# Patient Record
Sex: Male | Born: 1980 | Race: White | Hispanic: No | Marital: Single | Smoking: Never smoker
Health system: Southern US, Community
[De-identification: ages and names within clinical notes are randomized; demographics above are authoritative.]

---

## 2020-04-12 ENCOUNTER — Emergency Department (HOSPITAL_BASED_OUTPATIENT_CLINIC_OR_DEPARTMENT_OTHER)
Admission: EM | Admit: 2020-04-12 | Discharge: 2020-04-12 | Disposition: A | Discharge status: Home / Self Care | Payer: Self-pay | Attending: Emergency Medicine | Admitting: Emergency Medicine

## 2020-04-12 ENCOUNTER — Emergency Department (HOSPITAL_BASED_OUTPATIENT_CLINIC_OR_DEPARTMENT_OTHER): Payer: Self-pay

## 2020-04-12 ENCOUNTER — Encounter (HOSPITAL_BASED_OUTPATIENT_CLINIC_OR_DEPARTMENT_OTHER): Payer: Self-pay | Admitting: *Deleted

## 2020-04-12 ENCOUNTER — Other Ambulatory Visit: Payer: Self-pay

## 2020-04-12 DIAGNOSIS — N503 Cyst of epididymis: Secondary | ICD-10-CM | POA: Insufficient documentation

## 2020-04-12 LAB — URINALYSIS, ROUTINE W REFLEX MICROSCOPIC
Bilirubin Urine: NEGATIVE
Glucose, UA: NEGATIVE mg/dL
Hgb urine dipstick: NEGATIVE
Ketones, ur: NEGATIVE mg/dL
Leukocytes,Ua: NEGATIVE
Nitrite: NEGATIVE
Protein, ur: NEGATIVE mg/dL
Specific Gravity, Urine: 1.025 (ref 1.005–1.030)
pH: 6 (ref 5.0–8.0)

## 2020-04-12 NOTE — ED Provider Notes (Signed)
Emergency Department Provider Note   I have reviewed the triage vital signs and the nursing notes.   HISTORY  Chief Complaint Testicle Pain   HPI Keith Sutton is a 40 y.o. male presents to the emergency department for evaluation of 3 weeks of left testicle pressure.  Denies severe, twisting or stabbing pain.  No discomfort with urination.  Pain is mainly at the top of the left testicle.  He feels some fullness there as well when he palpates but it is not significantly tender.  No concern for sexually transmitted infection.  No urethral discharge.  No lower abdomen or flank discomfort. No vomiting. No overlying skin changes.    No past medical history on file.  There are no problems to display for this patient.  Allergies Patient has no known allergies.  No family history on file.  Social History Social History   Tobacco Use  . Smoking status: Never Smoker  Substance Use Topics  . 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 ENT: No sore throat. Cardiovascular: Denies chest pain. Respiratory: Denies shortness of breath. Gastrointestinal: No abdominal pain.  No nausea, no vomiting.  No diarrhea.  No constipation. Genitourinary: Negative for dysuria. Positive left testicle pain.  Musculoskeletal: Negative for back pain. Skin: Negative for rash. Neurological: Negative for headaches, focal weakness or numbness.  10-point ROS otherwise negative.  ____________________________________________   PHYSICAL EXAM:  VITAL SIGNS: ED Triage Vitals  Enc Vitals Group     BP 04/12/20 1323 99/78     Pulse Rate 04/12/20 1323 94     Resp 04/12/20 1323 18     Temp 04/12/20 1323 97.8 F (36.6 C)     Temp Source 04/12/20 1323 Oral     SpO2 04/12/20 1323 100 %     Weight 04/12/20 1326 170 lb (77.1 kg)     Height  04/12/20 1326 5\' 7"  (1.702 m)   Constitutional: Alert and oriented. Well appearing and in no acute distress. Eyes: Conjunctivae are normal. Head: Atraumatic. Nose: No congestion/rhinnorhea. Mouth/Throat: Mucous membranes are moist. Neck: No stridor.   Cardiovascular: Normal rate, regular rhythm. Good peripheral circulation. Grossly normal heart sounds.   Respiratory: Normal respiratory effort.  No retractions. Lungs CTAB. Gastrointestinal: Soft and nontender. No distention.  Genitourinary: Nurse chaperone at bedside for exam. Patient's verbal consent obtained prior to exam. Normal testicular lye. Minimal discomfort at the superior aspect of the left testicle. No masses.  Musculoskeletal: No lower extremity tenderness nor edema.  Neurologic:  Normal speech and language. No gross focal neurologic deficits are appreciated.  Skin:  Skin is warm, dry and intact. No rash noted.  ____________________________________________   LABS (all labs ordered are listed, but only abnormal results are displayed)  Labs Reviewed  URINALYSIS, ROUTINE W REFLEX MICROSCOPIC  GC/CHLAMYDIA PROBE AMP ([place]) NOT AT [facility]   ____________________________________________  RADIOLOGY  OTTO [facility] SCROTUM W/DOPPLER  Result Date: 04/12/2020 CLINICAL DATA:  Left scrotal pressure for 3 weeks EXAM: SCROTAL ULTRASOUND DOPPLER ULTRASOUND OF THE TESTICLES TECHNIQUE: Complete ultrasound examination of the testicles, epididymis, and other scrotal structures was performed. Color and spectral Doppler ultrasound were also utilized to evaluate blood flow to the testicles. COMPARISON:  None. FINDINGS: Right testicle Measurements: 4.1 x 2.4 x 3.1 cm. No mass or microlithiasis visualized. Left testicle Measurements: 4.1  x 2.3 x 3.0 cm. No mass or microlithiasis visualized. Right epididymis: Grossly normal in size. Incidental epididymal cyst measuring 10 x 7 x 6 mm. Left epididymis: Mildly enlarged, due to a 13 x 11 x 14 mm simple  epididymal cyst. Hydrocele:  None visualized. Varicocele:  None visualized. Pulsed Doppler interrogation of both testes demonstrates normal low resistance arterial and venous waveforms bilaterally. IMPRESSION: 1. Unremarkable appearance of the bilateral testes. 2. Bilateral epididymal cysts, left larger than right, of doubtful clinical significance. Electronically Signed   By: Sharlet Salina M.D.   On: 04/12/2020 15:06    ____________________________________________   PROCEDURES  Procedure(s) performed:   Procedures  None  ____________________________________________   INITIAL IMPRESSION / ASSESSMENT AND PLAN / ED COURSE  Pertinent labs & imaging results that were available during my care of the patient were reviewed by me and considered in my medical decision making (see chart for details).   Patient presents to the emergency department with fairly mild pain in the top of the left testicle for the past 3 weeks.  My suspicion for torsion is low.  Be hydrocele versus epididymitis.  UA is negative.  Plan to send urine gonorrhea and chlamydia.  Patient with no urethral discharge to swab.   Testicular ultrasound is pending.  Care transferred to Dr. Renaye Rakers.    ____________________________________________  FINAL CLINICAL IMPRESSION(S) / ED DIAGNOSES  Final diagnoses:  Epididymal cyst     Note:  This document was prepared using Dragon voice recognition software and may include unintentional dictation errors.  Alona Bene, MD, [facility] Emergency Medicine    Lucella Pommier, Arlyss Repress, MD 04/13/20 [phone]

## 2020-04-12 NOTE — ED Notes (Signed)
ED Provider at bedside. 

## 2020-04-12 NOTE — ED Triage Notes (Signed)
Pain at left testicle, began approx 3 weeks. Having some pressure and swelling. No issues with voiding.

## 2020-04-12 NOTE — ED Provider Notes (Signed)
Patient received as signout from Dr Jacqulyn Bath.  Ultrasound showing epididymal cyst without evidence of inflammation.  I re-examined the patient and can palpate the cyst.  There is very minimal tenderness here.  I have a fairly low suspicion for infection or epididymitis.  We talked about antibiotics, and we've agreed to hold off on antibiotics at this time.  If he continues having symptoms he can follow up with a urologist, but his presentation is completely benign at this time.   Terald Sleeper, MD 04/12/20 [phone]

## 2020-04-12 NOTE — Discharge Instructions (Addendum)
You have a small cyst near the top of both testicles.  These are benign.  I did not feel this was infected at this time.  You can take tylenol and motrin as needed this week if it is uncomfortable, but this should get better.   If you have continued pain after several days, schedule an appointment with a urologist.  If you begin having worsening pain, change in color of your scrotum (turning dark red, blue, purple, or black), difficulty urinating, please return to the ER.

## 2020-04-12 NOTE — ED Notes (Signed)
Urine spec cup provided to pt

## 2020-04-13 LAB — GC/CHLAMYDIA PROBE AMP (~~LOC~~) NOT AT ARMC
Chlamydia: NEGATIVE
Comment: NEGATIVE
Comment: NORMAL
Neisseria Gonorrhea: NEGATIVE

## 2022-03-10 IMAGING — US US SCROTUM W/ DOPPLER COMPLETE
1 series · 14 of 25 positions shown · non-contrast
Comparison: None.

CLINICAL DATA: Left scrotal pressure for 3 weeks

EXAM:
SCROTAL ULTRASOUND
DOPPLER ULTRASOUND OF THE TESTICLES
TECHNIQUE: Complete ultrasound examination of the testicles, epididymis, and
other scrotal structures was performed. Color and spectral Doppler
ultrasound were also utilized to evaluate blood flow to the
testicles.

[Series 1: us scrotum w/ doppler complete · 37 acquisitions, 14 frames shown]
[im 1/37]
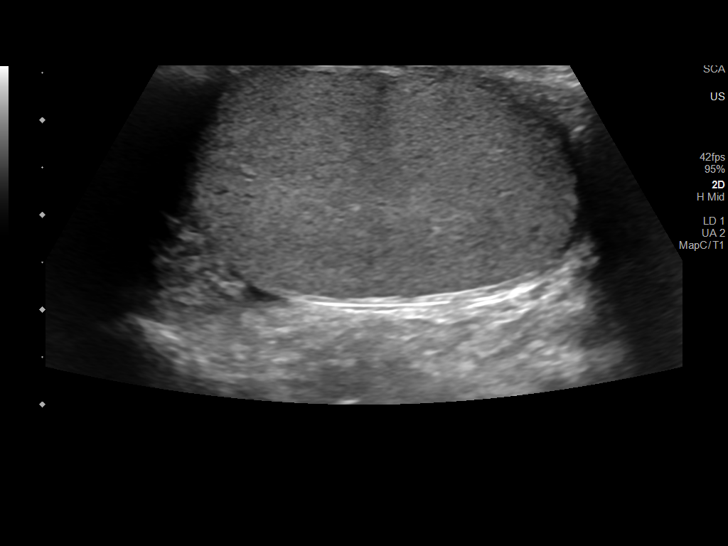
[im 4/37]
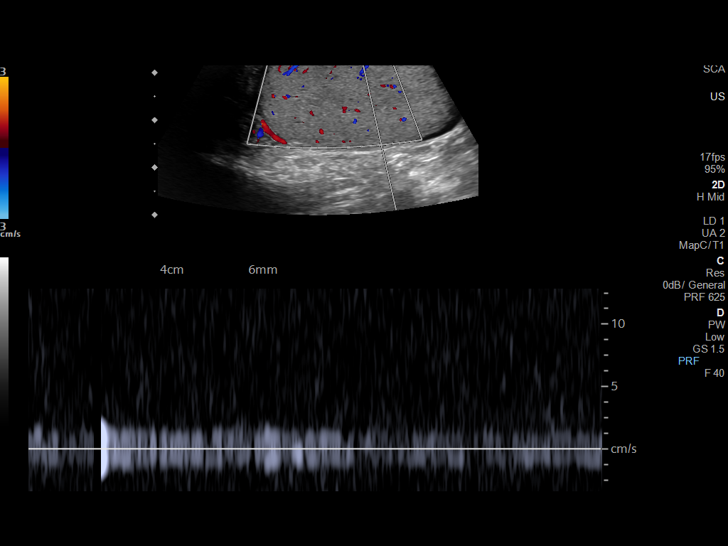
[im 7/37]
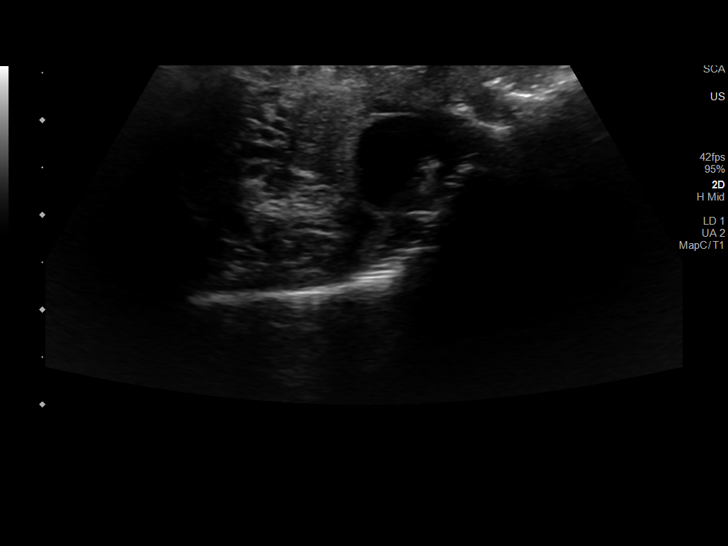
[im 10/37]
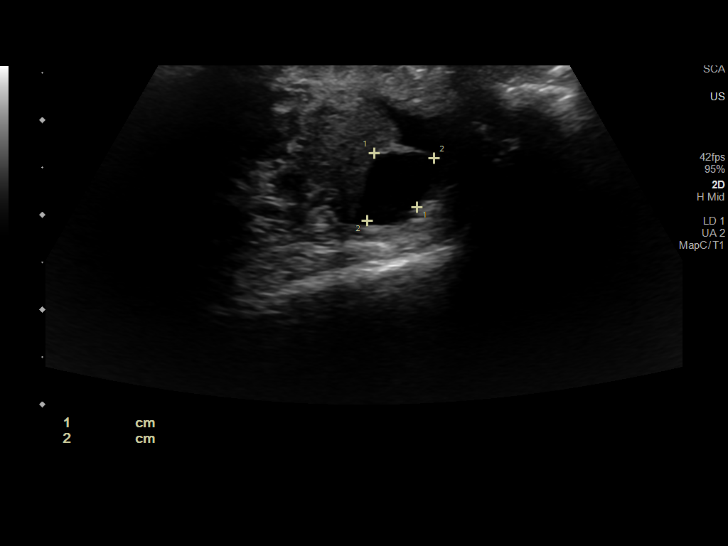
[im 13/37]
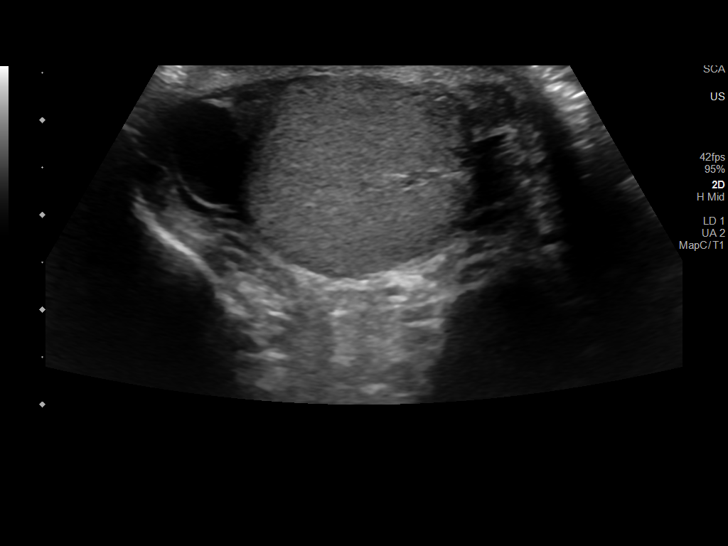
[im 14/37]
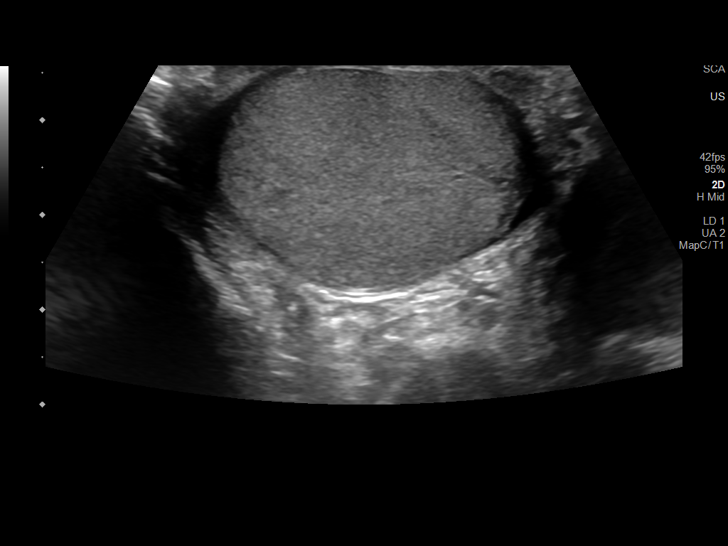
[im 17/37]
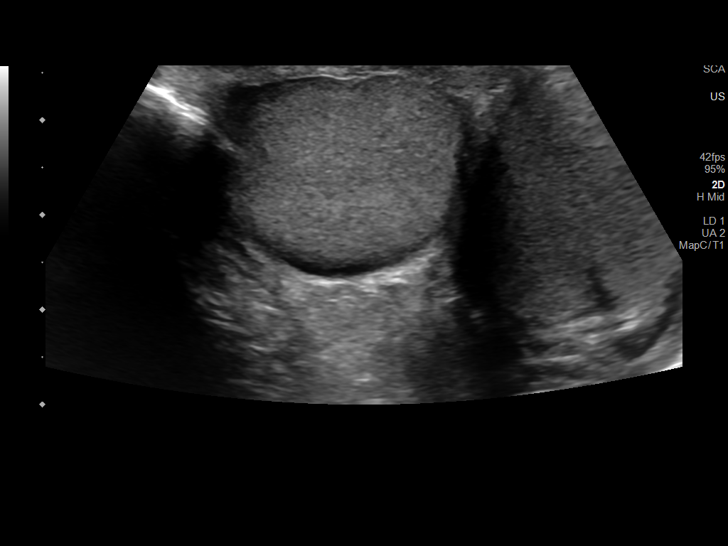
[im 20/37]
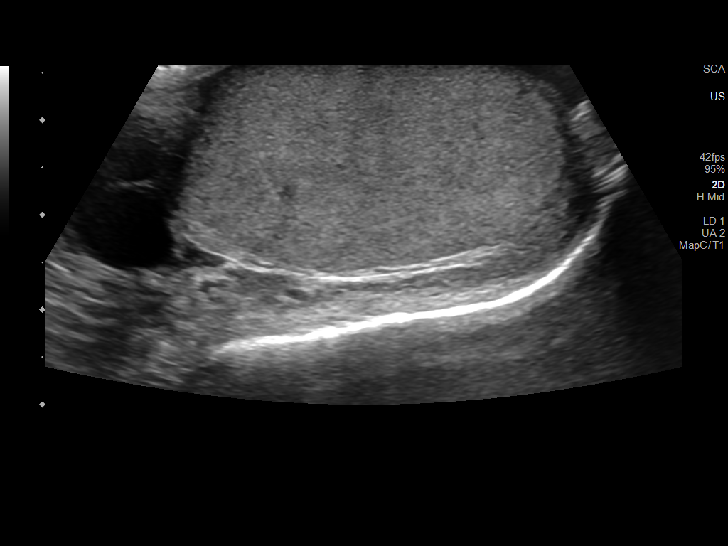
[im 23/37]
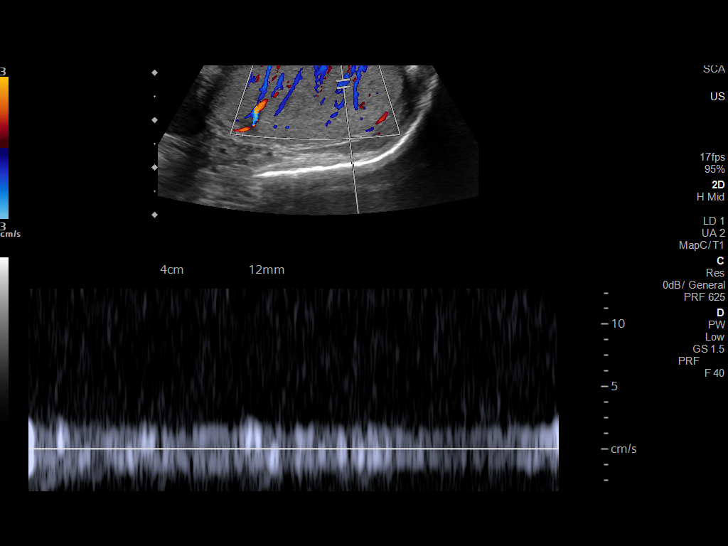
[im 25/37]
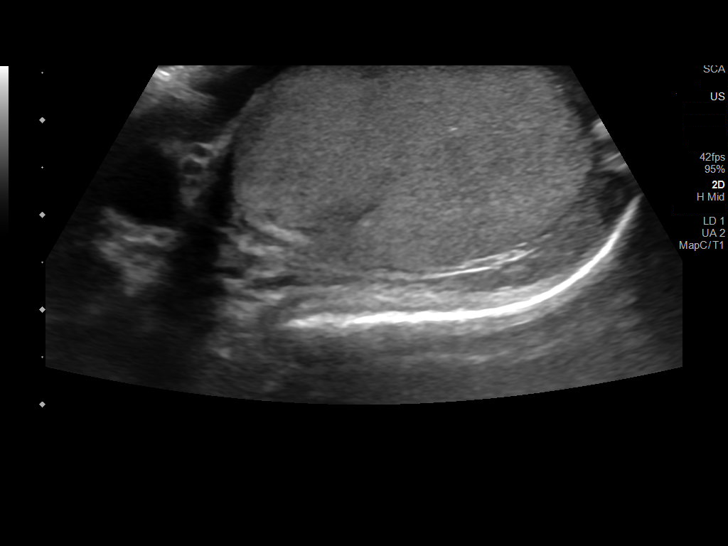
[im 28/37]
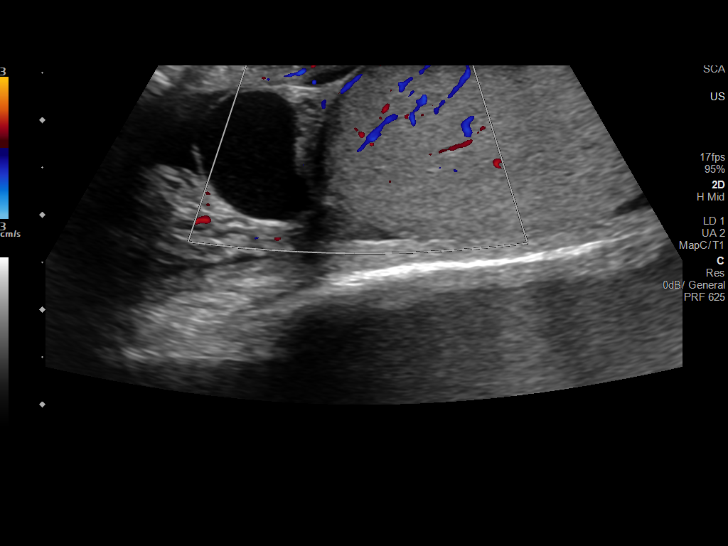
[im 31/37]
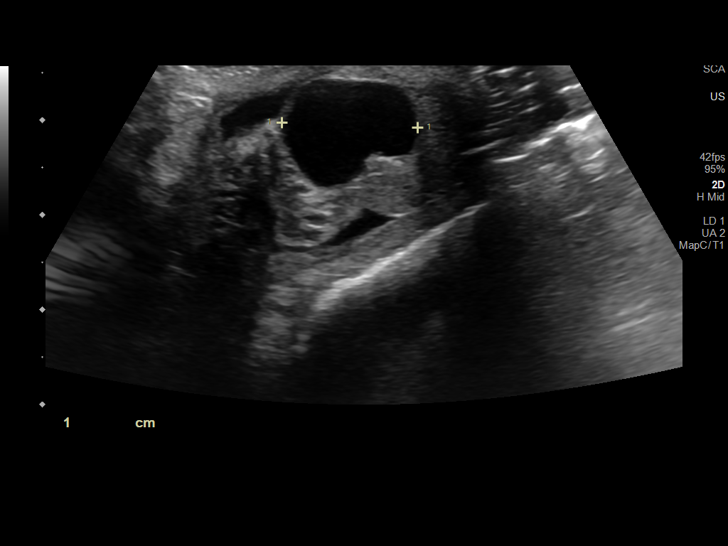
[im 34/37]
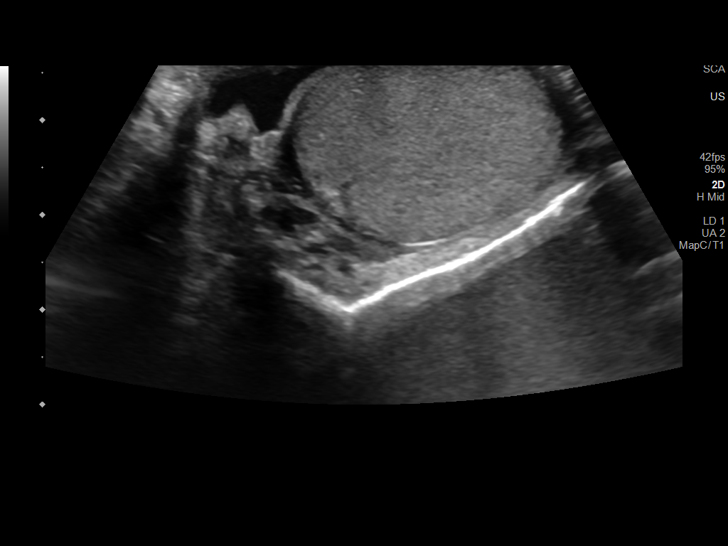
[im 37/37]
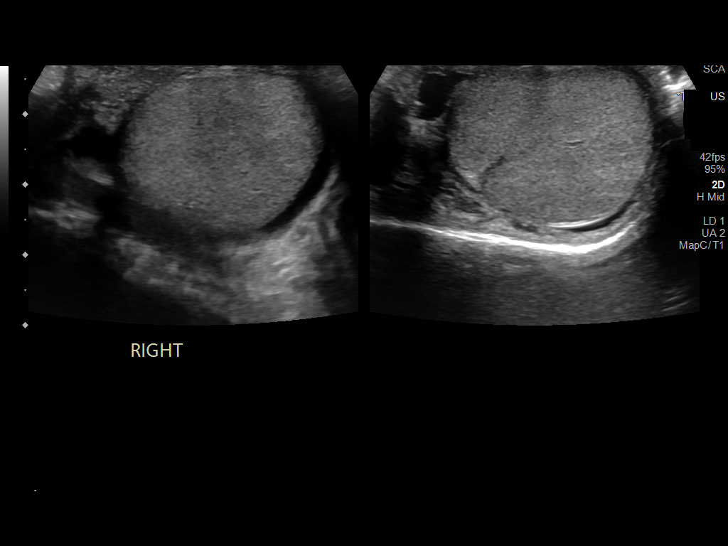

[14 of 25 positions shown; findings below may reference images not displayed]

FINDINGS: Right testicle

Measurements: 4.1 x 2.4 x 3.1 cm. No mass or microlithiasis
visualized.

Left testicle

Measurements: 4.1 x 2.3 x 3.0 cm. No mass or microlithiasis
visualized.

Right epididymis: Grossly normal in size. Incidental epididymal cyst
measuring 10 x 7 x 6 mm.

Left epididymis: Mildly enlarged, due to a 13 x 11 x 14 mm simple
epididymal cyst.

Hydrocele:  None visualized.

Varicocele:  None visualized.

Pulsed Doppler interrogation of both testes demonstrates normal low
resistance arterial and venous waveforms bilaterally.
IMPRESSION: 1. Unremarkable appearance of the bilateral testes.
2. Bilateral epididymal cysts, left larger than right, of doubtful
clinical significance.
